# Patient Record
Sex: Male | Born: 1985 | Race: Black or African American | Hispanic: No | Marital: Single | Smoking: Former smoker
Health system: Southern US, Community
[De-identification: ages and names within clinical notes are randomized; demographics above are authoritative.]

## PROBLEM LIST (undated history)

## (undated) HISTORY — PX: APPENDECTOMY: SHX54

---

## 2014-10-12 ENCOUNTER — Emergency Department (HOSPITAL_COMMUNITY): Payer: Self-pay

## 2014-10-12 ENCOUNTER — Encounter (HOSPITAL_COMMUNITY): Payer: Self-pay | Admitting: Emergency Medicine

## 2014-10-12 ENCOUNTER — Emergency Department (HOSPITAL_COMMUNITY)
Admission: EM | Admit: 2014-10-12 | Discharge: 2014-10-12 | Disposition: A | Discharge status: Home / Self Care | Payer: Self-pay | Attending: Emergency Medicine | Admitting: Emergency Medicine

## 2014-10-12 DIAGNOSIS — Y9389 Activity, other specified: Secondary | ICD-10-CM | POA: Insufficient documentation

## 2014-10-12 DIAGNOSIS — M25562 Pain in left knee: Secondary | ICD-10-CM

## 2014-10-12 DIAGNOSIS — S8992XA Unspecified injury of left lower leg, initial encounter: Secondary | ICD-10-CM | POA: Insufficient documentation

## 2014-10-12 DIAGNOSIS — Y9289 Other specified places as the place of occurrence of the external cause: Secondary | ICD-10-CM | POA: Insufficient documentation

## 2014-10-12 DIAGNOSIS — Z720 Tobacco use: Secondary | ICD-10-CM | POA: Insufficient documentation

## 2014-10-12 DIAGNOSIS — Y998 Other external cause status: Secondary | ICD-10-CM | POA: Insufficient documentation

## 2014-10-12 DIAGNOSIS — W1839XA Other fall on same level, initial encounter: Secondary | ICD-10-CM | POA: Insufficient documentation

## 2014-10-12 MED ORDER — TRAMADOL HCL 50 MG PO TABS: 50.0000 mg | ORAL_TABLET | Freq: Four times a day (QID) | ORAL | Status: AC | PRN

## 2014-10-12 NOTE — ED Provider Notes (Signed)
CSN: [id number]     Arrival date & time 10/12/14  2120 History  This chart was scribed for non-physician practitioner, Sharilyn Sites, PA-C, working with Mirian Mo, MD, by Bronson Curb, ED Scribe. This patient was seen in room TR08C/TR08C and the patient's care was started at 11:15 PM.     Chief Complaint  Patient presents with  . Knee Pain    The history is provided by the patient. No language interpreter was used.     HPI Comments: Thomas Tyler is a 29 y.o. male, with no significant medical history, who presents to the Emergency Department complaining of sudden onset, constant, left knee pain that began this morning, approximately 15 hours ago at 0800. Patient states he fell this morning, but is unsure how. He reports EtOH consumption prior to fall and suspects he was intoxicated. He denies head injury or LOC. There is associated mild swelling of the left medial knee. He ambulates with a limp due to pain, and notes the pain is exacerbated with flexion and extension. Patient has no history of prior knee injuries or knee surgeries. He denies any other injuries.  History reviewed. No pertinent past medical history. History reviewed. No pertinent past surgical history. No family history on file. History  Substance Use Topics  . Smoking status: Current Every Day Smoker  . Smokeless tobacco: Not on file  . Alcohol Use: Yes    Review of Systems  Musculoskeletal: Positive for joint swelling and arthralgias (left knee).  Skin: Negative for wound.  Neurological: Negative for syncope and headaches.  All other systems reviewed and are negative.     Allergies  Review of patient's allergies indicates no known allergies.  Home Medications   Prior to Admission medications   Not on File   Triage Vitals: BP 116/60 mmHg  Pulse 80  Temp(Src) 97.9 F (36.6 C) (Oral)  Resp 16  Ht  (1.956 m)  Wt 176 lb (79.833 kg)  BMI 20.87 kg/m2  SpO2 97%  Physical Exam  Constitutional:  He is oriented to person, place, and time. He appears well-developed and well-nourished.  HENT:  Head: Normocephalic and atraumatic.  Mouth/Throat: Oropharynx is clear and moist.  Eyes: Conjunctivae and EOM are normal. Pupils are equal, round, and reactive to light.  Neck: Normal range of motion.  Cardiovascular: Normal rate, regular rhythm and normal heart sounds.   Pulmonary/Chest: Effort normal and breath sounds normal.  Musculoskeletal: Normal range of motion.       Left knee: He exhibits swelling. Tenderness found. Medial joint line tenderness noted.  Left knee with mild swelling and tenderness along medial joint line, no gross bony deformities, full flexion and extension maintained; pain when valgus stress applied; ACL without laxity; leg remains NVI; steady gait  Neurological: He is alert and oriented to person, place, and time.  Skin: Skin is warm and dry.  Psychiatric: He has a normal mood and affect.  Nursing note and vitals reviewed.   ED Course  Procedures (including critical care time)  DIAGNOSTIC STUDIES: Oxygen Saturation is 97% on room air, adequate by my interpretation.    COORDINATION OF CARE: At 2320 Discussed treatment plan with patient which includes knee sleeve . Patient agrees.   Labs Review Labs Reviewed - No data to display  Imaging Review Dg Knee Complete 4 Views Left  10/12/2014   CLINICAL DATA:  Patient says he walked outside this am and fell. he doesn't remember if passed out, but he remembers a friend helping him  stand. Left medial knee pain, worse when weight bearing and/or pivoting.  EXAM: LEFT KNEE - COMPLETE 4+ VIEW  COMPARISON:  None.  FINDINGS: There is no evidence of fracture, dislocation, or joint effusion. There is no evidence of arthropathy or other focal bone abnormality. Soft tissues are unremarkable.  IMPRESSION: Negative.   Electronically Signed   By: Amie Portlandavid  Ormond M.D.   On: 10/12/2014 22:45     EKG Interpretation None      MDM    Final diagnoses:  Knee pain, left   29 year old male with left knee injury earlier this morning while intoxicated. No head injury or loss of consciousness. States he has pain along his medial joint line. He does have mild swelling on exam, no gross bony deformities. He does have pain when valgus stress is applied. Leg remains neurovascularly intact. X-ray was obtained which is negative for acute findings. Suspect strain.  Patient placed in knee sleeve.  Encouraged RICE routine at home, tramadol for pain. He will FU with orthopedics if no improvement in the next week.  Discussed plan with patient, he/she acknowledged understanding and agreed with plan of care.  Return precautions given for new or worsening symptoms.  I personally performed the services described in this documentation, which was scribed in my presence. The recorded information has been reviewed and is accurate.  Garlon HatchetLisa M Jessaca Philippi, PA-C 10/12/14 2357  Mirian MoMatthew Gentry, MD 10/13/14 [phone]

## 2014-10-12 NOTE — Discharge Instructions (Signed)
Take the prescribed medication as directed.  May wish to ice/elevate knee at home to help with pain/swelling. Follow-up with Dr. Shon BatonBrooks if no improvement in 1 week. Return to the ED for new or worsening symptoms.

## 2014-10-12 NOTE — ED Notes (Addendum)
C/o L knee pain with movement since falling around 8am this morning.  States he doesn't remember falling but denies LOC.  Reports etoh use. Denies pain at present.

## 2015-07-31 ENCOUNTER — Emergency Department: Payer: Self-pay

## 2015-07-31 ENCOUNTER — Emergency Department
Admission: EM | Admit: 2015-07-31 | Discharge: 2015-07-31 | Disposition: A | Discharge status: Home / Self Care | Payer: Self-pay | Attending: Emergency Medicine | Admitting: Emergency Medicine

## 2015-07-31 ENCOUNTER — Encounter: Payer: Self-pay | Admitting: Emergency Medicine

## 2015-07-31 DIAGNOSIS — R1032 Left lower quadrant pain: Secondary | ICD-10-CM | POA: Insufficient documentation

## 2015-07-31 DIAGNOSIS — R509 Fever, unspecified: Secondary | ICD-10-CM | POA: Insufficient documentation

## 2015-07-31 DIAGNOSIS — R1030 Lower abdominal pain, unspecified: Secondary | ICD-10-CM

## 2015-07-31 DIAGNOSIS — R079 Chest pain, unspecified: Secondary | ICD-10-CM | POA: Insufficient documentation

## 2015-07-31 DIAGNOSIS — R197 Diarrhea, unspecified: Secondary | ICD-10-CM | POA: Insufficient documentation

## 2015-07-31 DIAGNOSIS — F17200 Nicotine dependence, unspecified, uncomplicated: Secondary | ICD-10-CM | POA: Insufficient documentation

## 2015-07-31 DIAGNOSIS — R51 Headache: Secondary | ICD-10-CM | POA: Insufficient documentation

## 2015-07-31 LAB — BASIC METABOLIC PANEL
Anion gap: 8 (ref 5–15)
BUN: 9 mg/dL (ref 6–20)
CALCIUM: 9.7 mg/dL (ref 8.9–10.3)
CO2: 27 mmol/L (ref 22–32)
CREATININE: 1.01 mg/dL (ref 0.61–1.24)
Chloride: 99 mmol/L — ABNORMAL LOW (ref 101–111)
Glucose, Bld: 92 mg/dL (ref 65–99)
Potassium: 3.2 mmol/L — ABNORMAL LOW (ref 3.5–5.1)
Sodium: 134 mmol/L — ABNORMAL LOW (ref 135–145)

## 2015-07-31 LAB — URINALYSIS COMPLETE WITH MICROSCOPIC (ARMC ONLY)
BILIRUBIN URINE: NEGATIVE
Bacteria, UA: NONE SEEN
Glucose, UA: NEGATIVE mg/dL
Leukocytes, UA: NEGATIVE
NITRITE: NEGATIVE
PH: 6 (ref 5.0–8.0)
Protein, ur: NEGATIVE mg/dL
RBC / HPF: NONE SEEN RBC/hpf (ref 0–5)
SQUAMOUS EPITHELIAL / LPF: NONE SEEN
Specific Gravity, Urine: 1.002 — ABNORMAL LOW (ref 1.005–1.030)

## 2015-07-31 LAB — CBC
HCT: 39.7 % — ABNORMAL LOW (ref 40.0–52.0)
Hemoglobin: 13.4 g/dL (ref 13.0–18.0)
MCH: 30.5 pg (ref 26.0–34.0)
MCHC: 33.8 g/dL (ref 32.0–36.0)
MCV: 90.3 fL (ref 80.0–100.0)
PLATELETS: 169 10*3/uL (ref 150–440)
RBC: 4.39 MIL/uL — AB (ref 4.40–5.90)
RDW: 12.1 % (ref 11.5–14.5)
WBC: 12.8 10*3/uL — AB (ref 3.8–10.6)

## 2015-07-31 LAB — TROPONIN I

## 2015-07-31 MED ORDER — IOHEXOL 240 MG/ML SOLN
25.0000 mL | Freq: Once | INTRAMUSCULAR | Status: AC | PRN
  Administered 2015-07-31: 25 mL via ORAL
  Filled 2015-07-31: qty 25

## 2015-07-31 MED ORDER — DICYCLOMINE HCL 20 MG PO TABS: 20.0000 mg | ORAL_TABLET | Freq: Three times a day (TID) | ORAL | Status: AC | PRN

## 2015-07-31 MED ORDER — IOHEXOL 300 MG/ML  SOLN
100.0000 mL | Freq: Once | INTRAMUSCULAR | Status: AC | PRN
  Administered 2015-07-31: 100 mL via INTRAVENOUS
  Filled 2015-07-31: qty 100

## 2015-07-31 NOTE — ED Notes (Signed)
Pt presents with abd pain, cramping, diarrhea and chest pain started Sunday. Also states has a headache.

## 2015-07-31 NOTE — ED Provider Notes (Signed)
[facility] Emergency Department Provider Note  Time seen: 6:31 PM  I have reviewed the triage vital signs and the nursing notes.   HISTORY  Chief Complaint Chest Pain and Abdominal Pain    HPI Thomas Tyler is a 29 y.o. male with no past medical history presents the emergency department with lower abdominal pain, diarrhea, fever, headache and chest pain. According to the patient for the past 4 days he has had fever with diarrhea, and lower abdominal pain. He states over the past 2 days he has intermittently had chest pains as well as a headache. Denies any chest pain currently. Continues with mild to moderate lower abdominal pain. Denies nausea or vomiting. States a fever of 102 Sunday as well as yesterday evening. Patient states he is status post appendectomy. Denies dysuria or hematuria.     History reviewed. No pertinent past medical history.  There are no active problems to display for this patient.   History reviewed. No pertinent past surgical history.  Current Outpatient Rx  Name  Route  Sig  Dispense  Refill  . traMADol (ULTRAM) 50 MG tablet   Oral   Take 1 tablet (50 mg total) by mouth every 6 (six) hours as needed.   15 tablet   0     Allergies Review of patient's allergies indicates no known allergies.  No family history on file.  Social History Social History  Substance Use Topics  . Smoking status: Current Every Day Smoker  . Smokeless tobacco: None  . Alcohol Use: Yes    Review of Systems Constitutional: Positive fever to 102 at home Cardiovascular: Negative for chest pain. Respiratory: Negative for shortness of breath. Gastrointestinal: Positive lower abdominal pain. Negative for nausea, vomiting. Positive for diarrhea. Denies black or bloody stool. Genitourinary: Negative for dysuria. Negative hematuria. Musculoskeletal: Negative for back pain. Neurological: Negative for headache 10-point ROS otherwise  negative.  ____________________________________________   PHYSICAL EXAM:  VITAL SIGNS: ED Triage Vitals  Enc Vitals Group     BP 07/31/15 1731 124/56 mmHg     Pulse Rate 07/31/15 1730 90     Resp 07/31/15 1730 20     Temp 07/31/15 1730 98.7 F (37.1 C)     Temp Source 07/31/15 1730 Oral     SpO2 07/31/15 1730 96 %     Weight --      Height --      Head Cir --      Peak Flow --      Pain Score 07/31/15 1732 2     Pain Loc --      Pain Edu? --      Excl. in GC? --     Constitutional: Alert and oriented. Well appearing and in no distress. Eyes: Normal exam ENT   Head: Normocephalic and atraumatic.   Mouth/Throat: Mucous membranes are moist. Cardiovascular: Normal rate, regular rhythm. No murmur Respiratory: Normal respiratory effort without tachypnea nor retractions. Breath sounds are clear Gastrointestinal: Soft, moderate lower abdominal tenderness palpation greater in the left lower quadrant. No rebound or guarding. No distention. No CVA tenderness. Musculoskeletal: Nontender with normal range of motion in all extremities.  Neurologic:  Normal speech and language. No gross focal neurologic deficits Skin:  Skin is warm, dry and intact.  Psychiatric: Mood and affect are normal. Speech and behavior are normal.   ____________________________________________    RADIOLOGY  CT shows no acute abnormality  EKG reviewed and interpreted by myself shows normal sinus rhythm at  81 bpm, narrow QRS, normal axis, normal intervals, nonspecific ST changes. No ST elevations. ____________________________________________    INITIAL IMPRESSION / ASSESSMENT AND PLAN / ED COURSE  Pertinent labs & imaging results that were available during my care of the patient were reviewed by me and considered in my medical decision making (see chart for details).  Labs are largely within normal limits. As the patient has moderate lower abdominal tenderness palpation, reported fever of 102 last  night, as well as continued diarrhea we will proceed with a CT abdomen/pelvis to help further evaluate. Patient denies any chest pain currently states he's been having intermittent chest pain and headaches. Labs including troponin are within normal limits. Chest x-ray negative. EKG reassuring.  Labs and CT showed no acute abnormality. Patient overall appears very well. We'll discharge her on a short course of Bentyl as needed for abdominal cramping/discomfort. [facility].  ____________________________________________   FINAL CLINICAL IMPRESSION(S) / ED DIAGNOSES  Lower abdominal pain Diarrhea   Minna AntisKevin Lashanda Storlie, MD 07/31/15 2008

## 2015-07-31 NOTE — Discharge Instructions (Signed)
Please take her medication as needed, as prescribed. Please follow-up with her primary care physician 2-3 days for recheck. Return to the emergency department for any worsening abdominal pain, continued fever, or any other symptom personally concerning to your self.   Abdominal Pain, Adult Many things can cause belly (abdominal) pain. Most times, the belly pain is not dangerous. Many cases of belly pain can be watched and treated at home. HOME CARE   Do not take medicines that help you go poop (laxatives) unless told to by your doctor.  Only take medicine as told by your doctor.  Eat or drink as told by your doctor. Your doctor will tell you if you should be on a special diet. GET HELP IF:  You do not know what is causing your belly pain.  You have belly pain while you are sick to your stomach (nauseous) or have runny poop (diarrhea).  You have pain while you pee or poop.  Your belly pain wakes you up at night.  You have belly pain that gets worse or better when you eat.  You have belly pain that gets worse when you eat fatty foods.  You have a fever. GET HELP RIGHT AWAY IF:   The pain does not go away within 2 hours.  You keep throwing up (vomiting).  The pain changes and is only in the right or left part of the belly.  You have bloody or tarry looking poop. MAKE SURE YOU:   Understand these instructions.  Will watch your condition.  Will get help right away if you are not doing well or get worse.   This information is not intended to replace advice given to you by your health care provider. Make sure you discuss any questions you have with your health care provider.   Document Released: 01/27/2008 Document Revised: 08/31/2014 Document Reviewed: 04/19/2013 Elsevier Interactive Patient Education [employer]2016 Elsevier Inc.

## 2016-10-12 IMAGING — CT CT ABD-PELV W/ CM
2 of 4 series · 15 of 46 positions shown, 17 images · IV contrast (omnipaque)
Comparison: None.

CLINICAL DATA: Bilateral lower quadrant pain and cramping, diarrhea
starting [REDACTED], chest pain, fever

EXAM:
CT ABDOMEN AND PELVIS WITH CONTRAST
TECHNIQUE: Multidetector CT imaging of the abdomen and pelvis was performed
using the standard protocol following bolus administration of
intravenous contrast.
CONTRAST:  100mL OMNIPAQUE IOHEXOL 300 MG/ML  SOLN

[Series 2: routine abd pel with · axial · 0.70mm/px · z∈[-224,+171]mm · 12 of 91 slices shown, 14 images]
[im 8/91  soft-tissue]
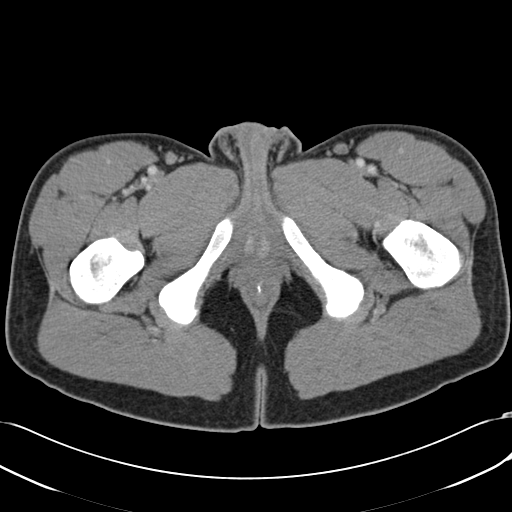
[im 8/91  bone]
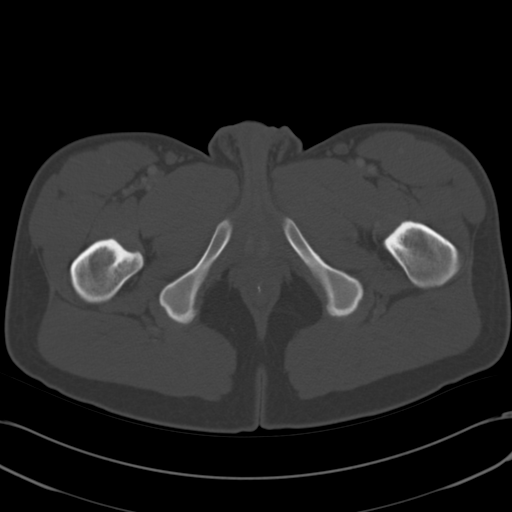
[im 15/91  soft-tissue]
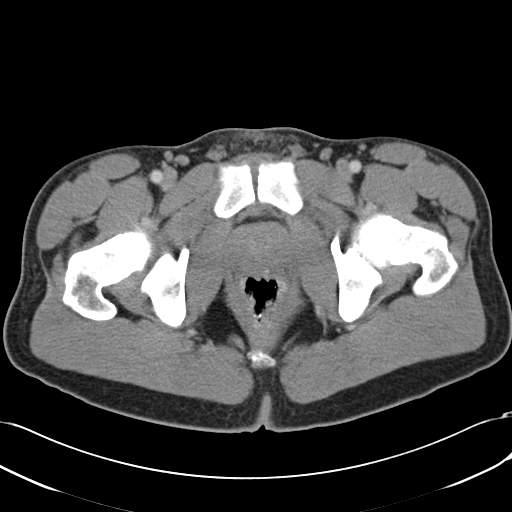
[im 22/91  soft-tissue]
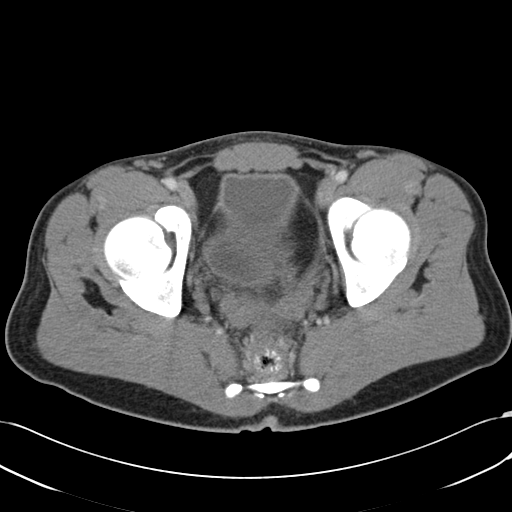
[im 29/91  soft-tissue]
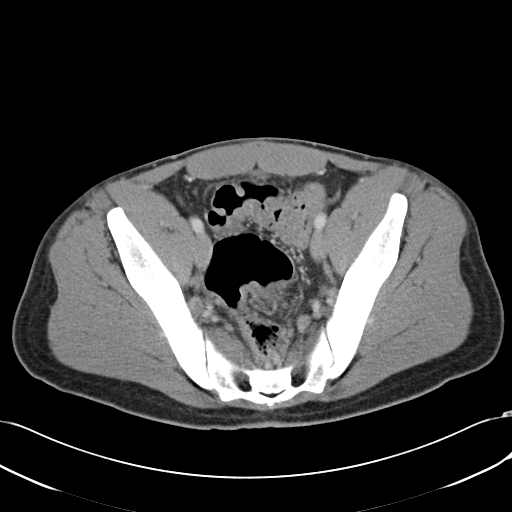
[im 37/91  soft-tissue]
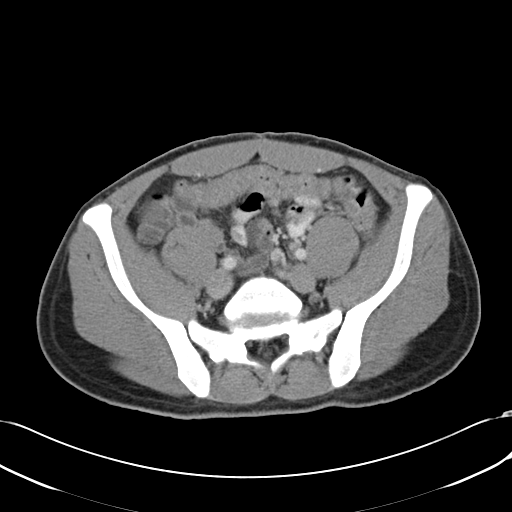
[im 44/91  soft-tissue]
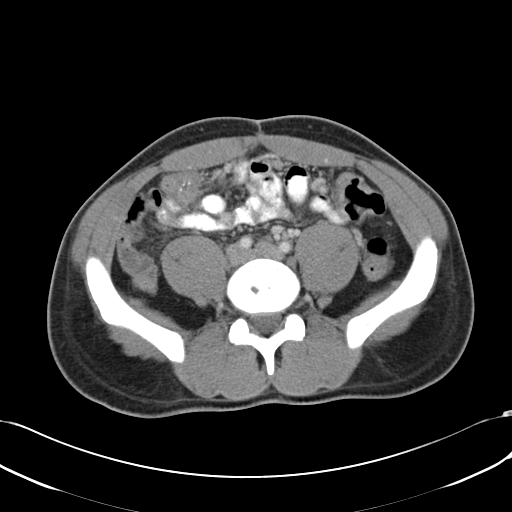
[im 51/91  soft-tissue]
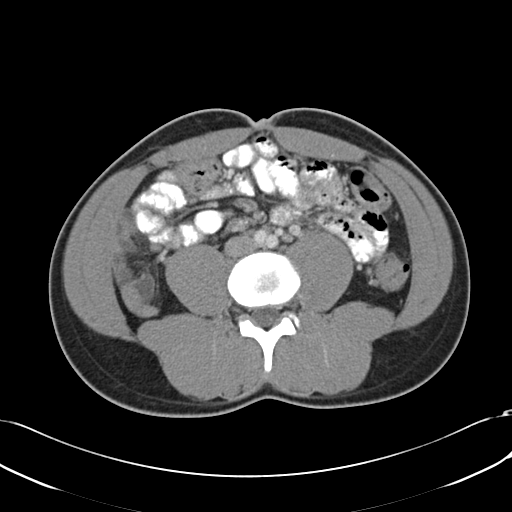
[im 58/91  soft-tissue]
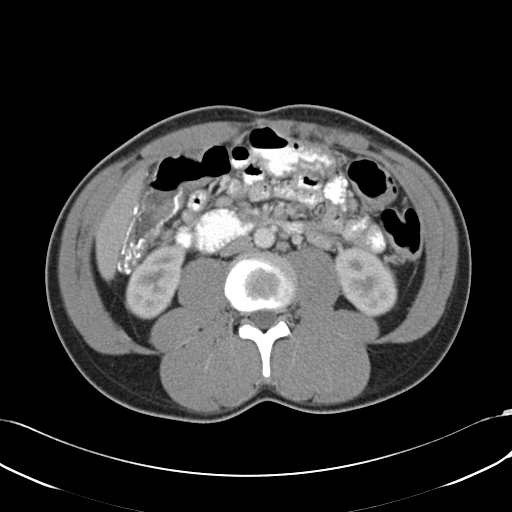
[im 65/91  soft-tissue]
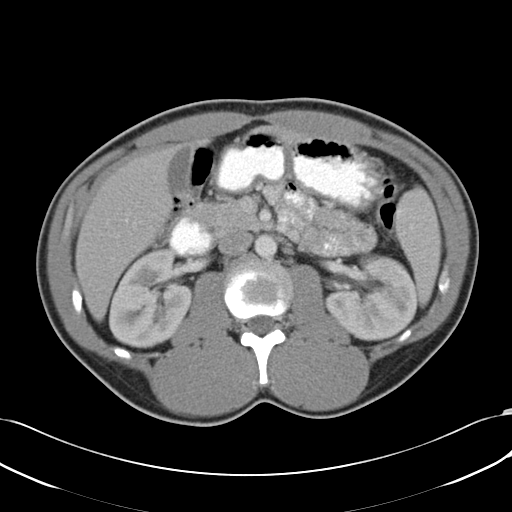
[im 65/91  bone]
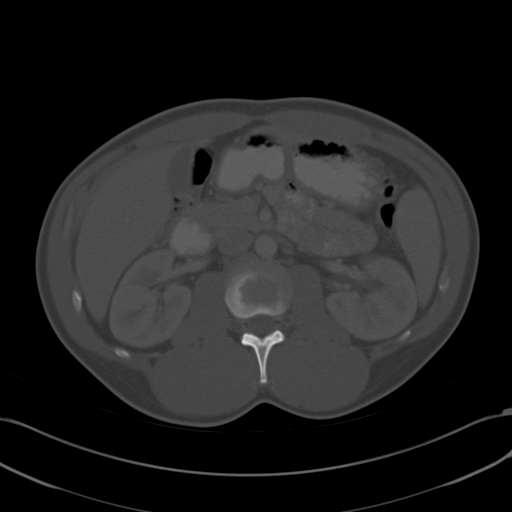
[im 73/91  soft-tissue]
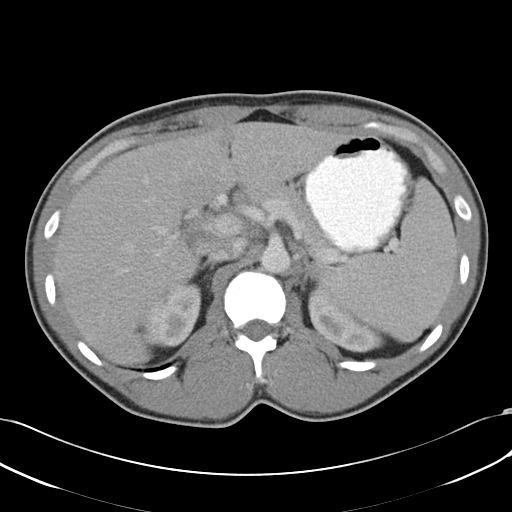
[im 80/91  soft-tissue]
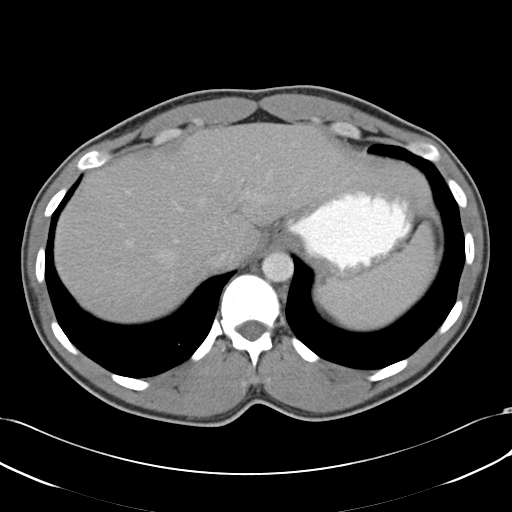
[im 87/91  soft-tissue]
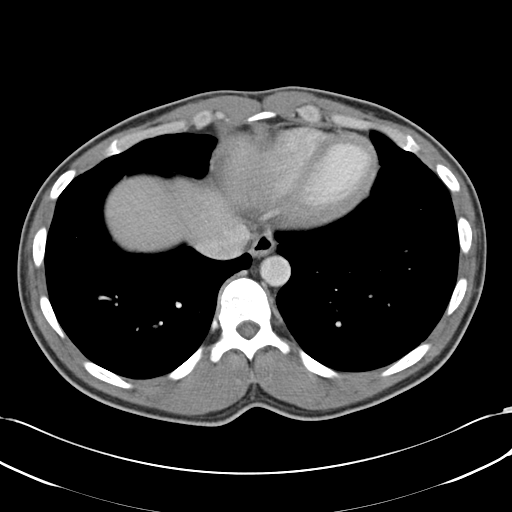

[Series 5: cor routine abd pel with · coronal · 0.60mm/px · 3 of 114 slices shown]
[im 38/114  soft-tissue]
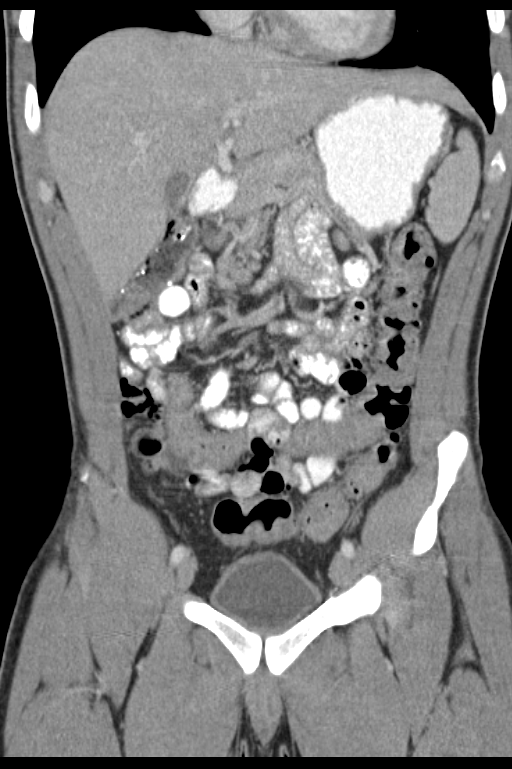
[im 51/114  soft-tissue]
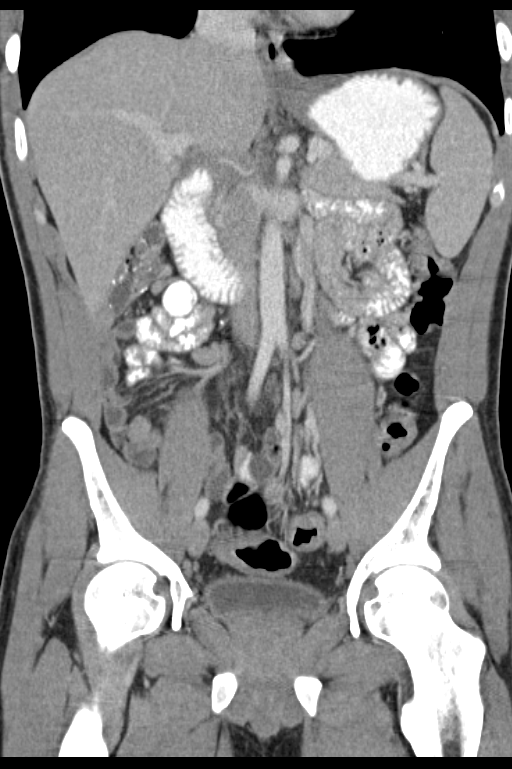
[im 63/114  soft-tissue]
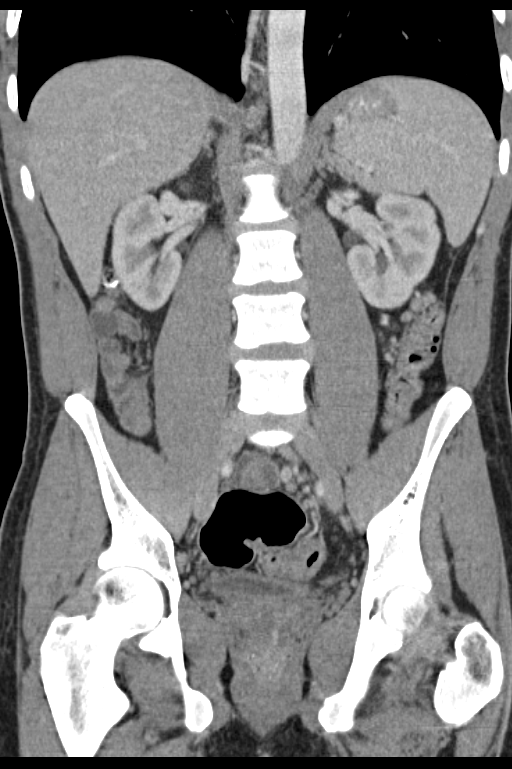

[15 of 46 positions shown; findings below may reference images not displayed]

FINDINGS: Lung bases are unremarkable. Sagittal images of the spine are
unremarkable. Liver, pancreas, spleen and adrenal glands are
unremarkable. No calcified gallstones are noted within gallbladder.

No aortic aneurysm.

Enhanced kidneys are symmetrical in size. No hydronephrosis or
hydroureter. There is no small bowel obstruction. No thickened or
dilated small bowel loops are noted. There is high position of the
cecum. The cecum is in right mid abdomen just posterior to inferior
aspect of the liver and anterior to lower pole of the right kidney.
There is no pericecal inflammation. The cecum is decompressed. The
appendix is not identified. Some colonic gas noted in hepatic
flexure and splenic flexure of the colon. Limited assessment of the
colon which is otherwise empty collapsed. Some colonic gas noted in
mid sigmoid colon. Some colonic gas noted within rectum. There is no
evidence of colonic obstruction. Tortuous sigmoid colon. Prostate
gland and seminal vesicles are unremarkable. The urinary bladder is
unremarkable. There is no definite evidence of acute diverticulitis.
No mesenteric fluid collection.
IMPRESSION: 1. No acute inflammatory process within abdomen.
2. No small bowel obstruction.
3. No hydronephrosis or hydroureter.
4. There is a high position of the cecum in right flank. No
pericecal inflammation. The terminal ileum is unremarkable. The
appendix is not identified.
5. Some colonic gas noted in mid sigmoid colon, hepatic and splenic
flexure of the colon. No evidence of colonic obstruction. Limited
assessment of the colon which is not opacified with contrast and
empty collapsed. Tortuous sigmoid colon.
6. Unremarkable urinary bladder. Unremarkable prostate gland and
seminal vesicles.

## 2017-06-10 ENCOUNTER — Emergency Department
Admission: EM | Admit: 2017-06-10 | Discharge: 2017-06-10 | Disposition: A | Discharge status: Home / Self Care | Payer: Self-pay | Attending: Emergency Medicine | Admitting: Emergency Medicine

## 2017-06-10 ENCOUNTER — Encounter: Payer: Self-pay | Admitting: Emergency Medicine

## 2017-06-10 ENCOUNTER — Emergency Department: Payer: Self-pay

## 2017-06-10 DIAGNOSIS — J40 Bronchitis, not specified as acute or chronic: Secondary | ICD-10-CM | POA: Insufficient documentation

## 2017-06-10 DIAGNOSIS — Z87891 Personal history of nicotine dependence: Secondary | ICD-10-CM | POA: Insufficient documentation

## 2017-06-10 MED ORDER — PREDNISONE 10 MG PO TABS: ORAL_TABLET | ORAL | 0 refills | Status: AC

## 2017-06-10 MED ORDER — ALBUTEROL SULFATE HFA 108 (90 BASE) MCG/ACT IN AERS: 2.0000 | INHALATION_SPRAY | Freq: Four times a day (QID) | RESPIRATORY_TRACT | 0 refills | Status: AC | PRN

## 2017-06-10 MED ORDER — AZITHROMYCIN 250 MG PO TABS: ORAL_TABLET | ORAL | 0 refills | Status: AC

## 2017-06-10 MED ORDER — BENZONATATE 100 MG PO CAPS: 100.0000 mg | ORAL_CAPSULE | Freq: Three times a day (TID) | ORAL | 0 refills | Status: AC | PRN

## 2017-06-10 MED ORDER — IPRATROPIUM-ALBUTEROL 0.5-2.5 (3) MG/3ML IN SOLN
3.0000 mL | Freq: Once | RESPIRATORY_TRACT | Status: AC
  Administered 2017-06-10: 3 mL via RESPIRATORY_TRACT
  Filled 2017-06-10: qty 3

## 2017-06-10 NOTE — ED Triage Notes (Signed)
Pt c/o congestion and yellow productive cough X 2 days.  Fever 102 last night per pt.  No fever in triage; has not had any medication today.  C/o sore throat and pressure in ears.  Unlabored respirations. Ambulatory without difficulty.

## 2017-06-10 NOTE — ED Notes (Signed)
Agree with pa assessment

## 2017-06-10 NOTE — ED Provider Notes (Signed)
[facility] Emergency Department Provider Note  ____________________________________________  Time seen: Approximately 3:45 PM  I have reviewed the triage vital signs and the nursing notes.   HISTORY  Chief Complaint Nasal Congestion    HPI Thomas Tyler is a 31 y.o. male that presents to the emergency department for evaluation of nasal congestion, productive cough, and fever for 2 days.He took his temperature last night and it was 102. He has a history of allergies and asthma. He does not smoke. No alleviating measures have been attempted. No sick contacts. No fever, shortness of breath chest pain, nausea, vomiting, abdominal pain, fatigue, muscle aches.   History reviewed. No pertinent past medical history.  There are no active problems to display for this patient.   History reviewed. No pertinent surgical history.  Prior to Admission medications   Medication Sig Start Date End Date Taking? Authorizing Provider  albuterol (PROVENTIL HFA;VENTOLIN HFA) 108 (90 Base) MCG/ACT inhaler Inhale 2 puffs into the lungs every 6 (six) hours as needed for wheezing or shortness of breath. 06/10/17   Enid DerryWagner, Dodge Ator, PA-C  azithromycin (ZITHROMAX Z-PAK) 250 MG tablet Take 2 tablets (500 mg) on  Day 1,  followed by 1 tablet (250 mg) once daily on Days 2 through 5. 06/10/17   Enid DerryWagner, Evanna Washinton, PA-C  benzonatate (TESSALON PERLES) 100 MG capsule Take 1 capsule (100 mg total) by mouth 3 (three) times daily as needed for cough. 06/10/17 06/10/18  Enid DerryWagner, Ashtyn Meland, PA-C  dicyclomine (BENTYL) 20 MG tablet Take 1 tablet (20 mg total) by mouth 3 (three) times daily as needed for spasms. 07/31/15 07/30/16  Minna AntisPaduchowski, Kevin, MD  predniSONE (DELTASONE) 10 MG tablet Take 6 tablets on day 1, take 5 tablets on day 2, take 4 tablets on day 3, take 3 tablets on day 4, take 2 tablets on day 5, take 1 tablet on day 6 06/10/17   Enid DerryWagner, Ragen Laver, PA-C  traMADol (ULTRAM) 50 MG tablet Take 1 tablet (50  mg total) by mouth every 6 (six) hours as needed. 10/12/14   Garlon HatchetSanders, Lisa M, PA-C    Allergies Patient has no known allergies.  History reviewed. No pertinent family history.  Social History Social History  Substance Use Topics  . Smoking status: Former Games developermoker  . Smokeless tobacco: Never Used  . Alcohol use Yes     Review of Systems  Constitutional: No fever/chills Eyes: No visual changes. No discharge. ENT: Positive for congestion and rhinorrhea. Cardiovascular: No chest pain. Respiratory: Positive for cough. No SOB. Gastrointestinal: No abdominal pain.  No nausea, no vomiting.  No diarrhea.  No constipation. Musculoskeletal: Negative for musculoskeletal pain. Skin: Negative for rash, abrasions, lacerations, ecchymosis. Neurological: Negative for headaches.   ____________________________________________   PHYSICAL EXAM:  VITAL SIGNS: ED Triage Vitals [06/10/17 1501]  Enc Vitals Group     BP 115/63     Pulse Rate 86     Resp 18     Temp 98.8 F (37.1 C)     Temp Source Oral     SpO2 98 %     Weight 180 lb (81.6 kg)     Height 6\' 5"  (1.956 m)     Head Circumference      Peak Flow      Pain Score      Pain Loc      Pain Edu?      Excl. in GC?      Constitutional: Alert and oriented. Well appearing and in no acute distress. Eyes:  Conjunctivae are normal. PERRL. EOMI. No discharge. Head: Atraumatic. ENT: No frontal and maxillary sinus tenderness.      Ears: Tympanic membranes pearly gray with good landmarks. No discharge.      Nose: Mild congestion/rhinnorhea.      Mouth/Throat: Mucous membranes are moist. Oropharynx non-erythematous. Tonsils not enlarged. No exudates. Uvula midline. Neck: No stridor.   Hematological/Lymphatic/Immunilogical: No cervical lymphadenopathy. Cardiovascular: Normal rate, regular rhythm.  Good peripheral circulation. Respiratory: Normal respiratory effort without tachypnea or retractions. Scattered wheezes. Good air entry to the  bases with no decreased or absent breath sounds. Gastrointestinal: Bowel sounds 4 quadrants. Soft and nontender to palpation. No guarding or rigidity. No palpable masses. No distention. Musculoskeletal: Full range of motion to all extremities. No gross deformities appreciated. Neurologic:  Normal speech and language. No gross focal neurologic deficits are appreciated.  Skin:  Skin is warm, dry and intact. No rash noted. Psychiatric: Mood and affect are normal. Speech and behavior are normal. Patient exhibits appropriate insight and judgement.   ____________________________________________   LABS (all labs ordered are listed, but only abnormal results are displayed)  Labs Reviewed - No data to display ____________________________________________  EKG   ____________________________________________  RADIOLOGY Lexine Baton, personally viewed and evaluated these images (plain radiographs) as part of my medical decision making, as well as reviewing the written report by the radiologist.  Dg Chest 2 View  Result Date: 06/10/2017 CLINICAL DATA:  Productive cough and fever for 2 days. EXAM: CHEST  2 VIEW COMPARISON:  PA and lateral chest 07/31/2015. FINDINGS: The lungs are clear. There is some peribronchial thickening. No pneumothorax or pleural effusion. Heart size is normal. No bony abnormality. IMPRESSION: Mild peribronchial thickening compatible with bronchitis. No focal process. Electronically Signed   By: Drusilla Kanner M.D.   On: 06/10/2017 15:30    ____________________________________________    PROCEDURES  Procedure(s) performed:    Procedures    Medications  ipratropium-albuterol (DUONEB) 0.5-2.5 (3) MG/3ML nebulizer solution 3 mL (3 mLs Nebulization Given 06/10/17 1559)    __________________________________________   INITIAL IMPRESSION / ASSESSMENT AND PLAN / ED COURSE  Pertinent labs & imaging results that were available during my care of the patient were  reviewed by me and considered in my medical decision making (see chart for details).  Review of the [place] CSRS was performed in accordance of the [facility] prior to dispensing any controlled drugs.   Patient's diagnosis is consistent with bronchitis. Vital signs and exam are reassuring. Patient was given a DuoNeb. Patient appears well and is staying well hydrated. Patient should alternate tylenol and ibuprofen for fever. Patient feels comfortable going home. Patient will be discharged home with prescriptions for azithromycin, prednisone, Tessalon Perles. Patient is to follow up with PCP as needed or otherwise directed. Patient is given ED precautions to return to the ED for any worsening or new symptoms.     ____________________________________________  FINAL CLINICAL IMPRESSION(S) / ED DIAGNOSES  Final diagnoses:  Bronchitis      NEW MEDICATIONS STARTED DURING THIS VISIT:  Discharge Medication List as of 06/10/2017  4:14 PM    START taking these medications   Details  albuterol (PROVENTIL HFA;VENTOLIN HFA) 108 (90 Base) MCG/ACT inhaler Inhale 2 puffs into the lungs every 6 (six) hours as needed for wheezing or shortness of breath., Starting Thu 06/10/2017, Print    azithromycin (ZITHROMAX Z-PAK) 250 MG tablet Take 2 tablets (500 mg) on  Day 1,  followed by 1 tablet (250 mg) once daily on Days  2 through 5., Print    benzonatate (TESSALON PERLES) 100 MG capsule Take 1 capsule (100 mg total) by mouth 3 (three) times daily as needed for cough., Starting Thu 06/10/2017, Until Fri 06/10/2018, Print    predniSONE (DELTASONE) 10 MG tablet Take 6 tablets on day 1, take 5 tablets on day 2, take 4 tablets on day 3, take 3 tablets on day 4, take 2 tablets on day 5, take 1 tablet on day 6, Print            This chart was dictated using voice recognition software/Dragon. Despite best efforts to proofread, errors can occur which can change the meaning. Any change was purely unintentional.     Enid Derry, PA-C 06/10/17 1800    Don Perking [place], MD 06/10/17 [phone]

## 2017-09-08 ENCOUNTER — Encounter: Payer: Self-pay | Admitting: Emergency Medicine

## 2017-09-08 ENCOUNTER — Other Ambulatory Visit: Payer: Self-pay

## 2017-09-08 DIAGNOSIS — Z5321 Procedure and treatment not carried out due to patient leaving prior to being seen by health care provider: Secondary | ICD-10-CM | POA: Insufficient documentation

## 2017-09-08 DIAGNOSIS — R51 Headache: Secondary | ICD-10-CM | POA: Insufficient documentation

## 2017-09-08 NOTE — ED Triage Notes (Signed)
Patient ambulatory to triage with steady gait, without difficulty or distress noted, mask in place; pt reports this evening having fever, frontal HA

## 2017-09-09 ENCOUNTER — Encounter: Payer: Self-pay | Admitting: Emergency Medicine

## 2017-09-09 ENCOUNTER — Other Ambulatory Visit: Payer: Self-pay

## 2017-09-09 ENCOUNTER — Emergency Department
Admission: EM | Admit: 2017-09-09 | Discharge: 2017-09-10 | Disposition: A | Discharge status: Home / Self Care | Payer: Self-pay | Attending: Emergency Medicine | Admitting: Emergency Medicine

## 2017-09-09 ENCOUNTER — Emergency Department
Admission: EM | Admit: 2017-09-09 | Discharge: 2017-09-09 | Disposition: A | Discharge status: Home / Self Care | Payer: Self-pay | Attending: Emergency Medicine | Admitting: Emergency Medicine

## 2017-09-09 DIAGNOSIS — J020 Streptococcal pharyngitis: Secondary | ICD-10-CM | POA: Insufficient documentation

## 2017-09-09 DIAGNOSIS — Z87891 Personal history of nicotine dependence: Secondary | ICD-10-CM | POA: Insufficient documentation

## 2017-09-09 LAB — BASIC METABOLIC PANEL
Anion gap: 9 (ref 5–15)
BUN: 10 mg/dL (ref 6–20)
CO2: 26 mmol/L (ref 22–32)
Calcium: 9.2 mg/dL (ref 8.9–10.3)
Chloride: 101 mmol/L (ref 101–111)
Creatinine, Ser: 0.92 mg/dL (ref 0.61–1.24)
GFR calc non Af Amer: >60 mL/min (ref >60)
Glucose, Bld: 89 mg/dL (ref 65–99)
Potassium: 3.4 mmol/L — ABNORMAL LOW (ref 3.5–5.1)
SODIUM: 136 mmol/L (ref 135–145)

## 2017-09-09 LAB — CBC
HCT: 39.6 % — ABNORMAL LOW (ref 40.0–52.0)
Hemoglobin: 13.5 g/dL (ref 13.0–18.0)
MCH: 31.3 pg (ref 26.0–34.0)
MCHC: 34.1 g/dL (ref 32.0–36.0)
MCV: 91.8 fL (ref 80.0–100.0)
Platelets: 175 10*3/uL (ref 150–440)
RBC: 4.31 MIL/uL — AB (ref 4.40–5.90)
RDW: 13.5 % (ref 11.5–14.5)
WBC: 10.3 10*3/uL (ref 3.8–10.6)

## 2017-09-09 LAB — GROUP A STREP BY PCR: Group A Strep by PCR: DETECTED — AB

## 2017-09-09 MED ORDER — MAGIC MOUTHWASH: 15.0000 mL | Freq: Four times a day (QID) | ORAL | 0 refills | Status: AC | PRN

## 2017-09-09 MED ORDER — SODIUM CHLORIDE 0.9 % IV BOLUS (SEPSIS)
1000.0000 mL | Freq: Once | INTRAVENOUS | Status: AC
  Administered 2017-09-09: 1000 mL via INTRAVENOUS

## 2017-09-09 MED ORDER — ONDANSETRON 4 MG PO TBDP: 4.0000 mg | ORAL_TABLET | Freq: Three times a day (TID) | ORAL | 0 refills | Status: AC | PRN

## 2017-09-09 MED ORDER — IBUPROFEN 800 MG PO TABS
800.0000 mg | ORAL_TABLET | Freq: Once | ORAL | Status: AC
  Administered 2017-09-09: 800 mg via ORAL
  Filled 2017-09-09: qty 1

## 2017-09-09 MED ORDER — PENICILLIN G BENZATHINE 1200000 UNIT/2ML IM SUSP
1.2000 10*6.[IU] | Freq: Once | INTRAMUSCULAR | Status: AC
  Administered 2017-09-09: 1.2 10*6.[IU] via INTRAMUSCULAR
  Filled 2017-09-09: qty 2

## 2017-09-09 NOTE — ED Triage Notes (Signed)
Patient ambulatory to triage with steady gait, without difficulty or distress noted; pt here last night but left prior to being seen due to long wait; st frontal HA and fever since yesterday

## 2017-09-09 NOTE — ED Notes (Signed)
Reviewed discharge instructions, follow-up care, and prescriptions with patient. Patient verbalized understanding of all information reviewed. Patient stable, with no distress noted at this time.    

## 2017-09-09 NOTE — ED Provider Notes (Signed)
[facility] Emergency Department Provider Note  ____________________________________________  Time seen: Approximately 10:11 PM  I have reviewed the triage vital signs and the nursing notes.   HISTORY  Chief Complaint Headache    HPI Thomas Tyler is a 32 y.o. male Otherwise healthy and not immunocompromise presenting with 2-3 days of sore throat without cough, subjectively feeling warm, and frontal headache.  Yesterday, patient had 2 episodes of vomiting, now resolved; the patient has been able to tolerate liquids today without any difficulty.  The patient denies any difficulty swallowing or shortness of breath, ear pain, chills.  No abdominal pain or diarrhea.  The patient tried Motrin for his headache yesterday, which seemed to help but he has not tried anything today.  History reviewed. No pertinent past medical history.  There are no active problems to display for this patient.   Past Surgical History:  Procedure Laterality Date  . APPENDECTOMY      Current Outpatient Rx  . Order #: [id number] Class: Print  . Order #: [id number] Class: Print  . Order #: [id number] Class: Print  . Order #: [id number] Class: Print  . Order #: [id number] Class: Print  . Order #: [id number] Class: Print  . Order #: [id number] Class: Print  . Order #: [id number] Class: Print    Allergies Patient has no known allergies.  No family history on file.  Social History Social History   Tobacco Use  . Smoking status: Former Games developer  . Smokeless tobacco: Never Used  Substance Use Topics  . Alcohol use: Yes  . Drug use: No    Review of Systems Constitutional: No fever/chills.  No body aches. Eyes: No visual changes.  No eye discharge per ENT: Positive sore throat. No congestion or rhinorrhea.  No ear pain. Cardiovascular: Denies chest pain. Denies palpitations. Respiratory: Denies shortness of breath.  No cough. Gastrointestinal: No abdominal pain.  Positive nausea, positive  vomiting, now resolved.  No diarrhea.  No constipation. Genitourinary: Negative for dysuria. Musculoskeletal: Negative for back pain. Skin: Negative for rash. Neurological: Negative for headaches. No focal numbness, tingling or weakness.     ____________________________________________   PHYSICAL EXAM:  VITAL SIGNS: ED Triage Vitals [09/09/17 2116]  Enc Vitals Group     BP (!) 123/58     Pulse Rate 78     Resp 18     Temp 98.4 F (36.9 C)     Temp Source Oral     SpO2 100 %     Weight 180 lb (81.6 kg)     Height 6\' 5"  (1.956 m)     Head Circumference      Peak Flow      Pain Score 7     Pain Loc      Pain Edu?      Excl. in GC?     Constitutional: Alert and oriented. Well appearing and in no acute distress. Answers questions appropriately. Eyes: Conjunctivae are normal.  EOMI. No scleral icterus.  No eye discharge Head: Atraumatic. Nose: No congestion/rhinnorhea. Mouth/Throat: Mucous membranes are moist.  Mild posterior pharyngeal erythema without tonsillar swelling or exudate.  The posterior palate is symmetric and the uvula is midline. Neck: No stridor.  Supple.  No JVD.  No meningismus. Cardiovascular: Normal rate, regular rhythm. No murmurs, rubs or gallops.  Respiratory: Normal respiratory effort.  No accessory muscle use or retractions. Lungs CTAB.  No wheezes, rales or ronchi. Gastrointestinal: Soft, nontender and nondistended.  No guarding or rebound.  No peritoneal signs. Musculoskeletal: No LE edema.  Neurologic:  A&Ox3.  Speech is clear.  Face and smile are symmetric.  EOMI.  Moves all extremities well. Skin:  Skin is warm, dry and intact. No rash noted. Psychiatric: Mood and affect are normal. Speech and behavior are normal.  Normal judgement.  ____________________________________________   LABS (all labs ordered are listed, but only abnormal results are displayed)  Labs Reviewed  GROUP A STREP BY PCR - Abnormal; Notable for the following components:       Result Value   Group A Strep by PCR DETECTED (*)    All other components within normal limits  CBC - Abnormal; Notable for the following components:   RBC 4.31 (*)    HCT 39.6 (*)    All other components within normal limits  BASIC METABOLIC PANEL - Abnormal; Notable for the following components:   Potassium 3.4 (*)    All other components within normal limits  URINALYSIS, COMPLETE (UACMP) WITH MICROSCOPIC   ____________________________________________  EKG  Not indicated ____________________________________________  RADIOLOGY  No results found.  ____________________________________________   PROCEDURES  Procedure(s) performed: None  Procedures  Critical Care performed: No ____________________________________________   INITIAL IMPRESSION / ASSESSMENT AND PLAN / ED COURSE  Pertinent labs & imaging results that were available during my care of the patient were reviewed by me and considered in my medical decision making (see chart for details).  32 y.o. male with 3 days of sore throat, nausea and vomiting yesterday now resolved, and frontal headache that is mild and progressive.  Overall, the patient's symptoms are most consistent with a pharyngitis we will evaluate him for strep.  The patient's headache is likely due to his URI and I do not see any evidence of meningismus, nor acute bleed.  I have initiated symptomatic treatment and will reevaluate the patient for final disposition.  ----------------------------------------- 10:48 PM on 09/09/2017 -----------------------------------------  The patient has a potassium of 3.4, likely from his vomiting yesterday.  This should normalize and I will instruct the patient to have a recheck by his primary care physician.  The patient has a white blood cell count of 10.3.  He continues to be afebrile and hemodynamically stable.  I am awaiting the results of his strep testing and then anticipate  discharge.  ----------------------------------------- 10:56 PM on 09/09/2017 -----------------------------------------  Patient is positive for strep pharyngitis.  He will be given penicillin in the emergency department and discharged with symptomatic treatment. ____________________________________________  FINAL CLINICAL IMPRESSION(S) / ED DIAGNOSES  Final diagnoses:  Strep pharyngitis         NEW MEDICATIONS STARTED DURING THIS VISIT:  New Prescriptions   MAGIC MOUTHWASH SOLN    Take 15 mLs by mouth 4 (four) times daily as needed for mouth pain (swish and swallow).   ONDANSETRON (ZOFRAN ODT) 4 MG DISINTEGRATING TABLET    Take 1 tablet (4 mg total) by mouth every 8 (eight) hours as needed for nausea or vomiting.      Rockne MenghiniNorman, Anne-Caroline, MD 09/09/17 2256

## 2017-09-09 NOTE — ED Notes (Signed)
No answer when called from lobby 

## 2017-09-09 NOTE — Discharge Instructions (Signed)
Please return to the emergency department for severe pain, drooling, shortness of breath, or any other symptoms concerning to you.

## 2017-09-09 NOTE — ED Notes (Signed)
Called pharmacy to request medication 

## 2017-09-09 NOTE — ED Notes (Signed)
Patient c/o headache, sore throat beginning yesterday. Patient reports 2 emeses yesterday. Patient denies current nausea. Patient denies light sensitivity and visual changes. Patient's pupils equal and reactive to light.

## 2017-09-09 NOTE — ED Notes (Signed)
ED Provider at bedside. 

## 2018-08-23 IMAGING — CR DG CHEST 2V
1 series · 2 of 2 positions shown · non-contrast
Comparison: PA and lateral chest 07/31/2015.

CLINICAL DATA: Productive cough and fever for 2 days.

EXAM:
CHEST  2 VIEW

[Series 1: w chest pa · 0.14mm/px · 2 of 2 slices shown]
[im 1/2]
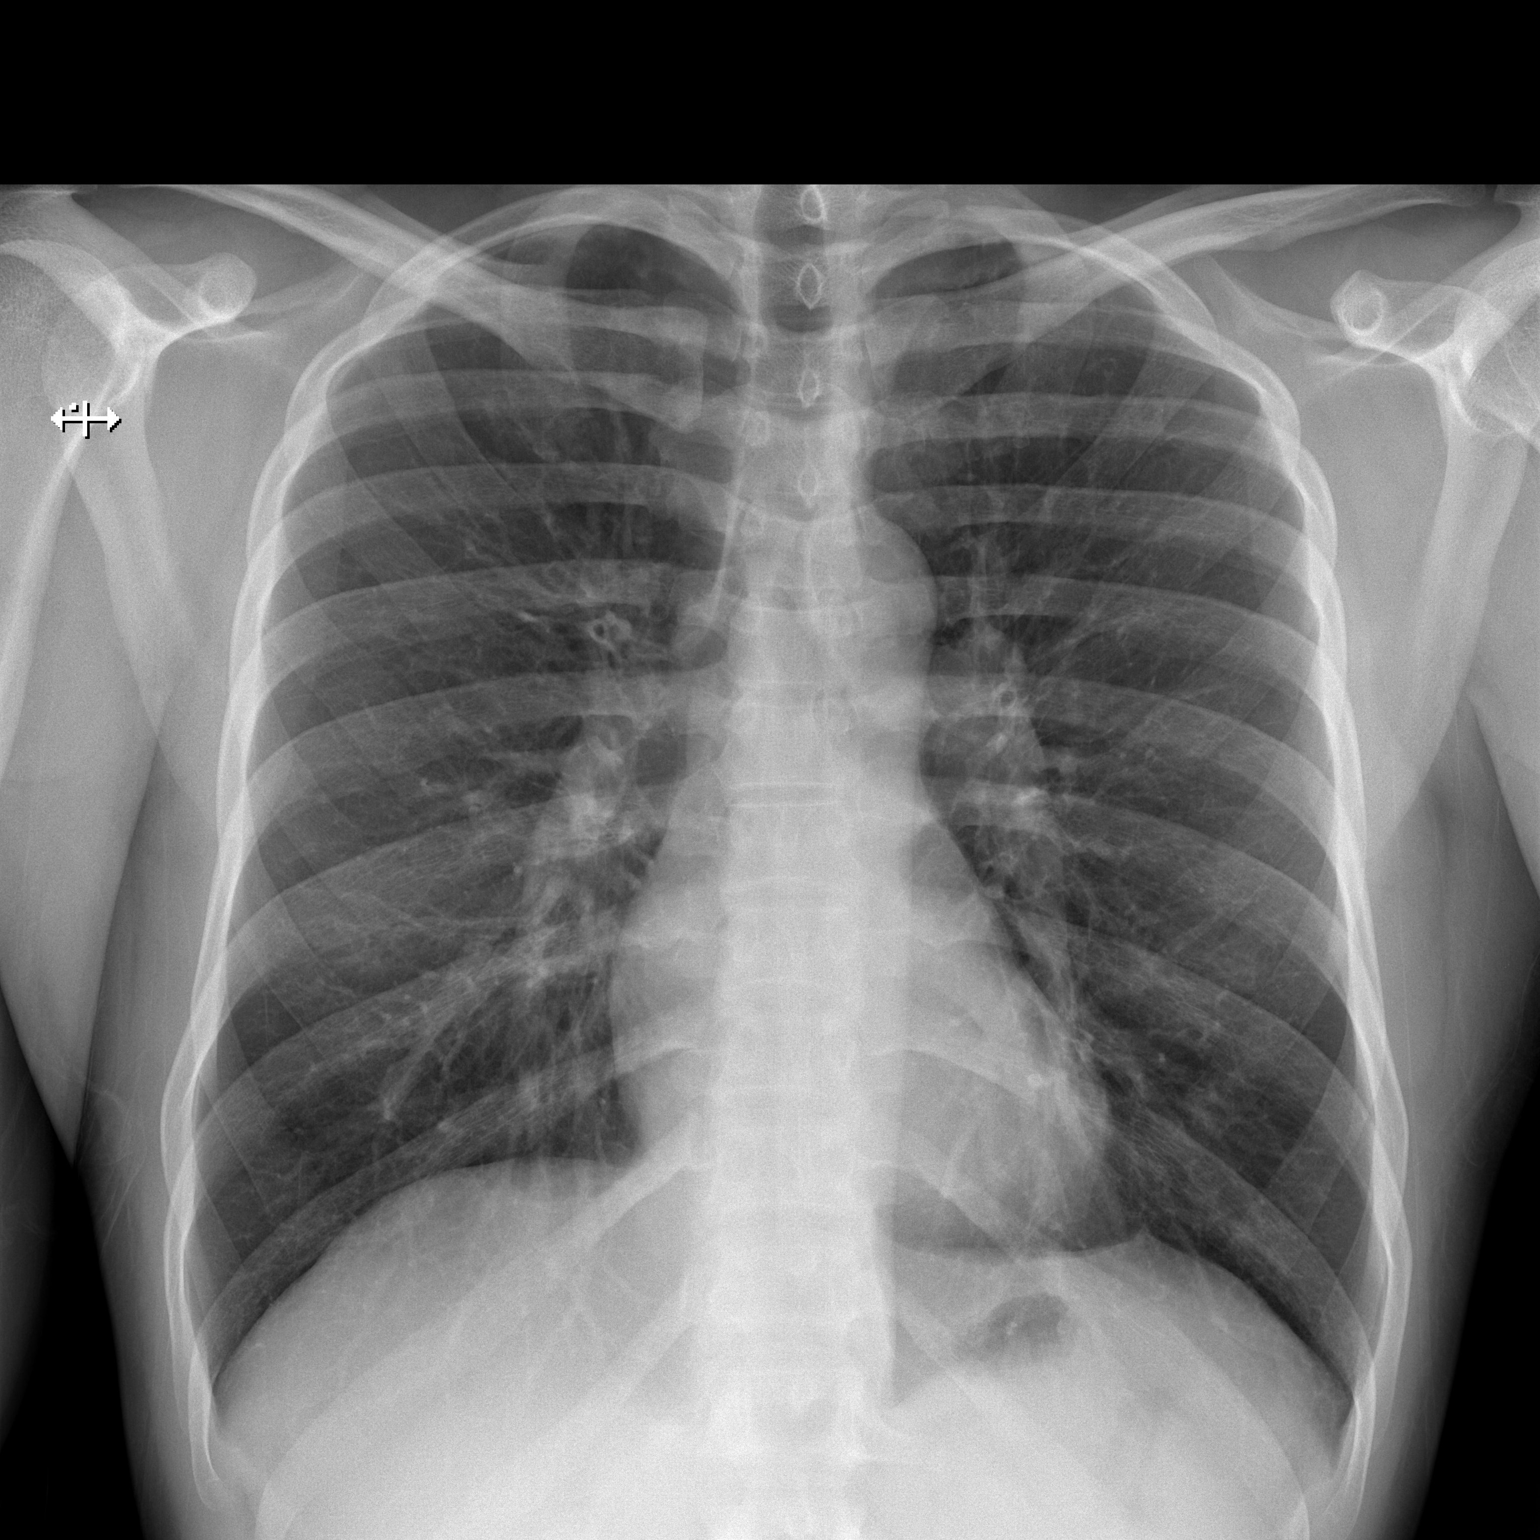
[im 2/2]
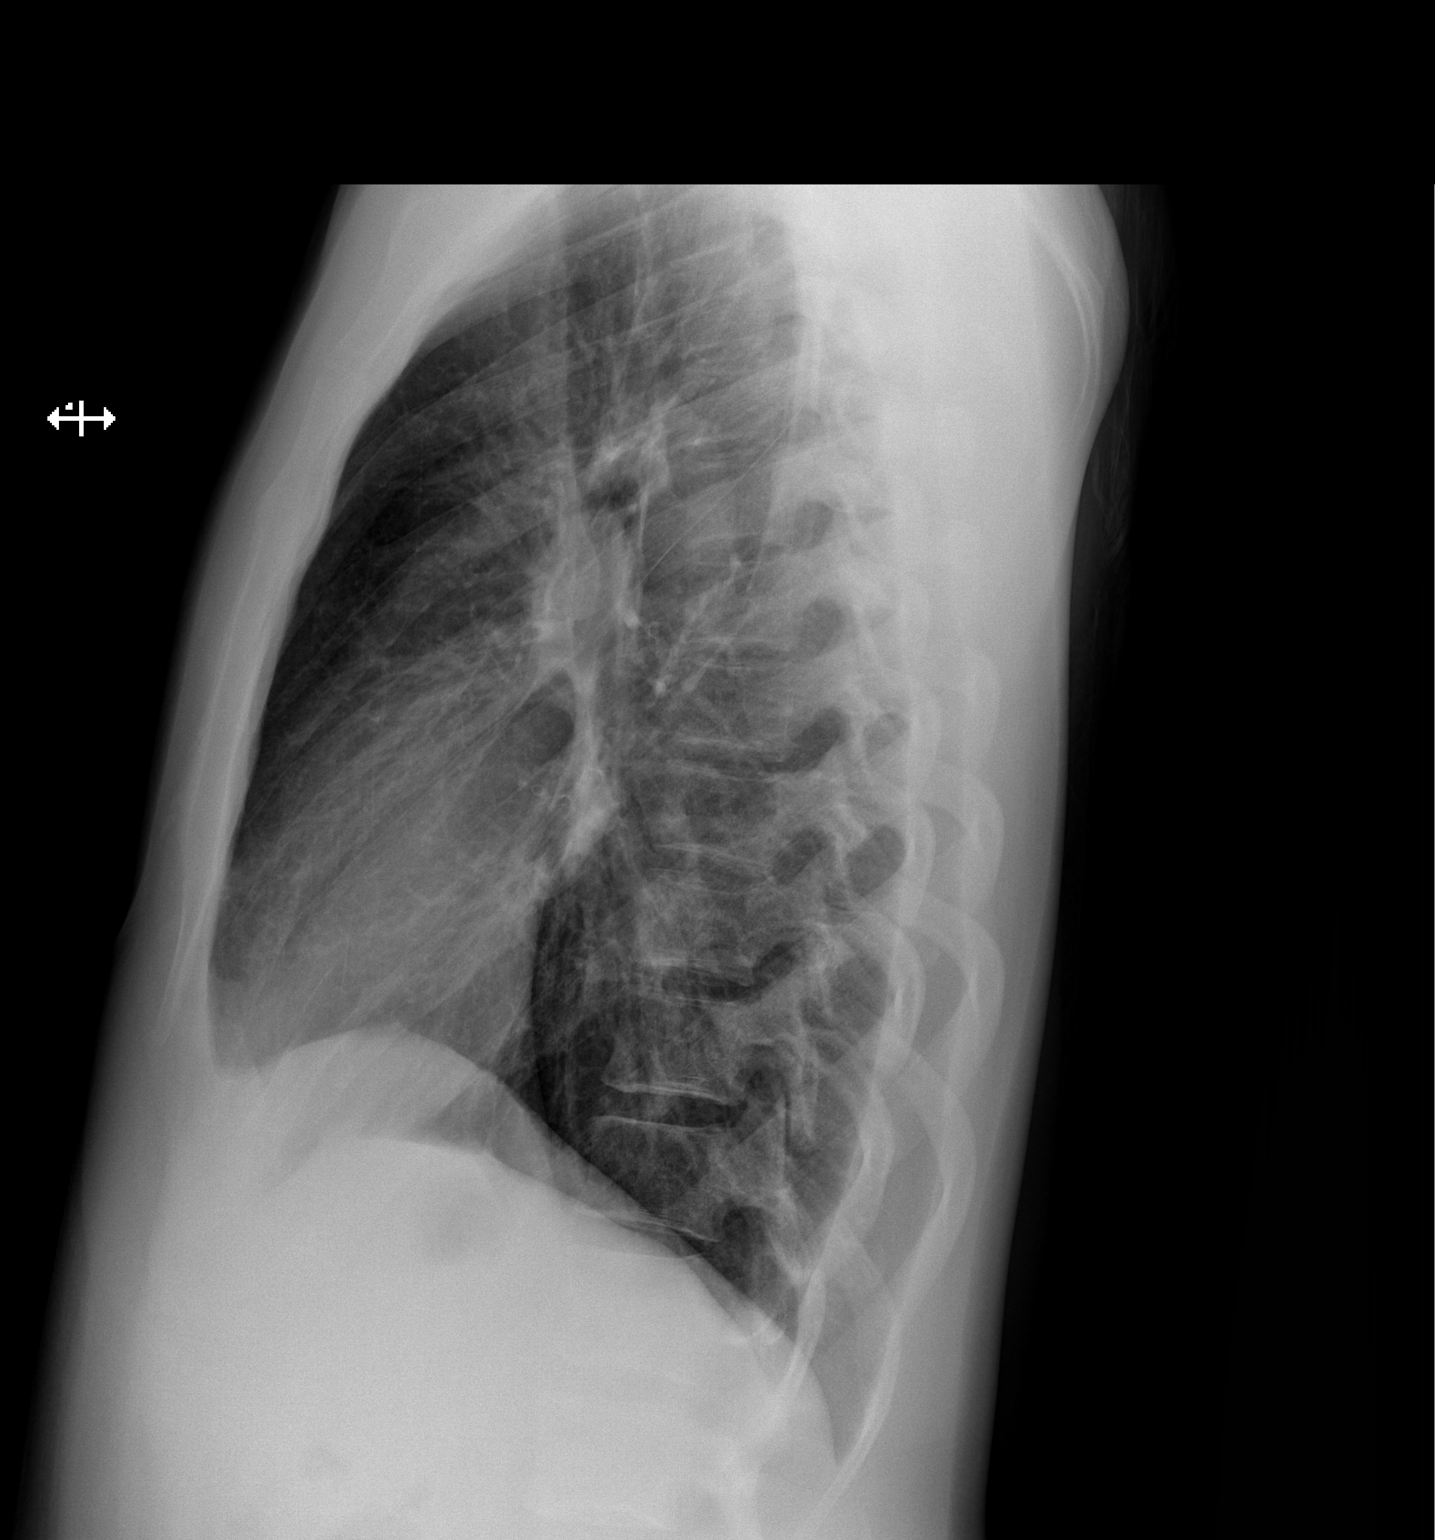

[2 of 2 positions shown; findings below may reference images not displayed]

FINDINGS: The lungs are clear. There is some peribronchial thickening. No
pneumothorax or pleural effusion. Heart size is normal. No bony
abnormality.
IMPRESSION: Mild peribronchial thickening compatible with bronchitis. No focal
process.
